# Patient Record
Sex: Female | Born: 2000 | Race: White | Hispanic: No | Marital: Single
Health system: Southern US, Community
[De-identification: ages and names within clinical notes are randomized; demographics above are authoritative.]

---
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
## 2019-03-07 IMAGING — CT CT ABD-PELV W/ CM
2 of 4 series · 16 of 46 positions shown, 18 images · IV contrast (iopamidol)
Comparison: None.

CLINICAL DATA: Acute lower abdominal pain.  Appendicitis suspected.

EXAM:
CT ABDOMEN AND PELVIS WITH CONTRAST
TECHNIQUE: Multidetector CT imaging of the abdomen and pelvis was performed
using the standard protocol following bolus administration of
intravenous contrast.
CONTRAST:  100mL M20DLF-CGG IOPAMIDOL (M20DLF-CGG) INJECTION 61%

[Series 2: axial st · axial · 0.73mm/px · z∈[+445,+850]mm · 13 of 89 slices shown, 15 images]
[im 4/89  soft-tissue]
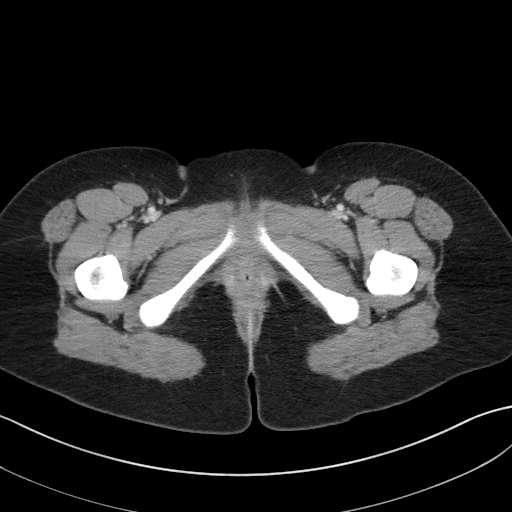
[im 4/89  bone]
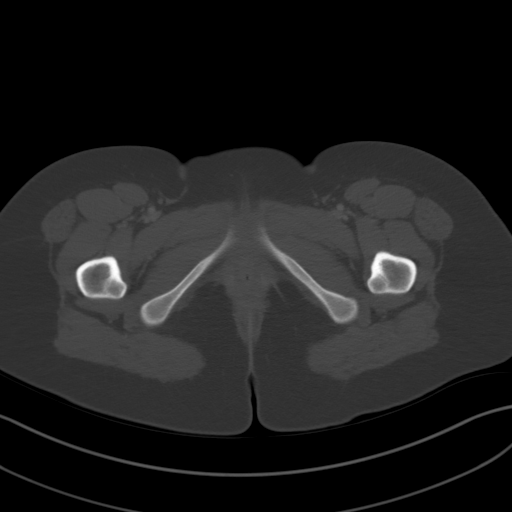
[im 12/89  soft-tissue]
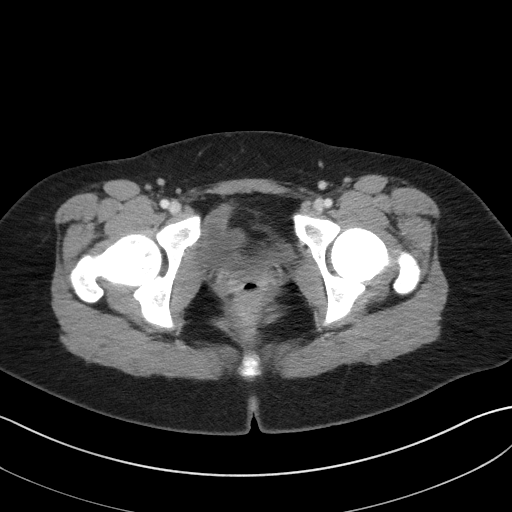
[im 19/89  soft-tissue]
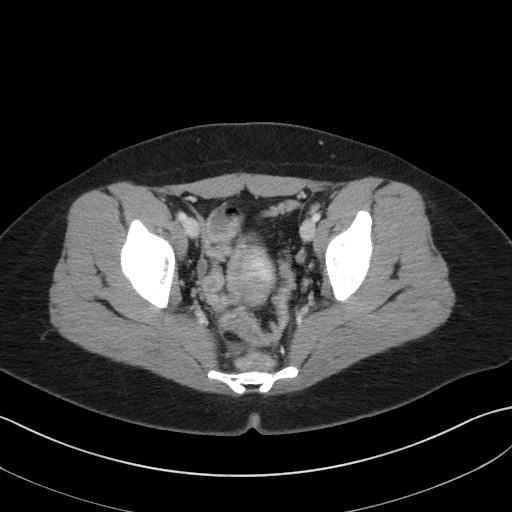
[im 26/89  soft-tissue]
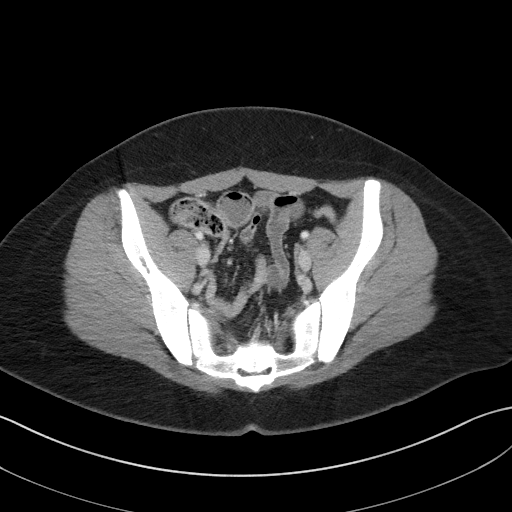
[im 30/89  soft-tissue]
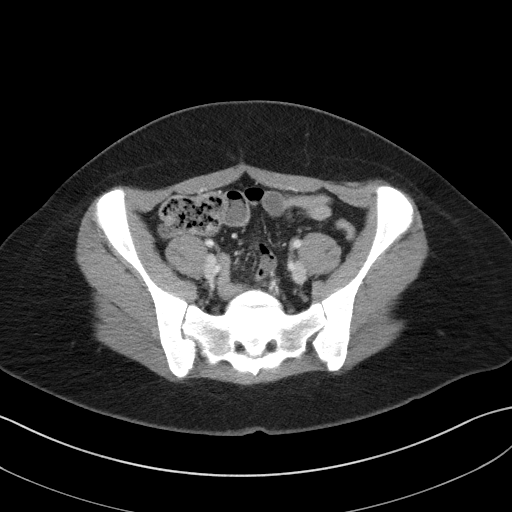
[im 37/89  soft-tissue]
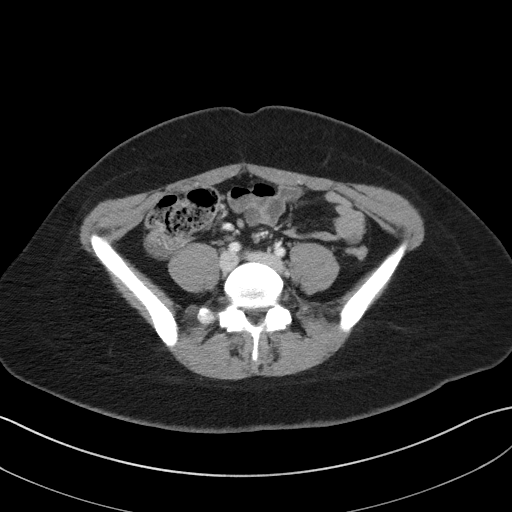
[im 45/89  soft-tissue]
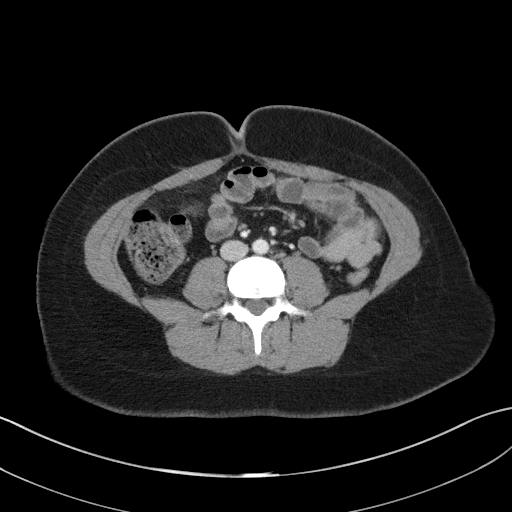
[im 52/89  soft-tissue]
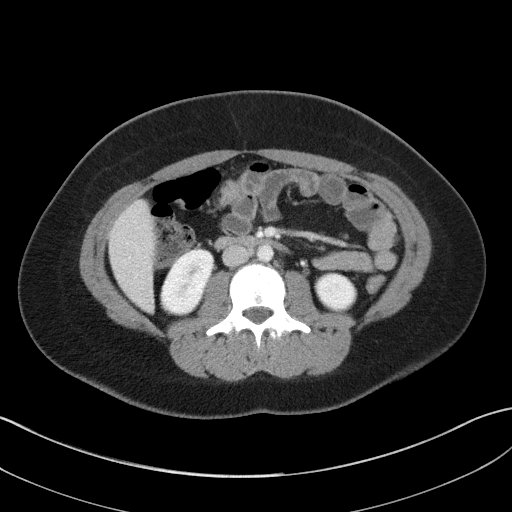
[im 59/89  soft-tissue]
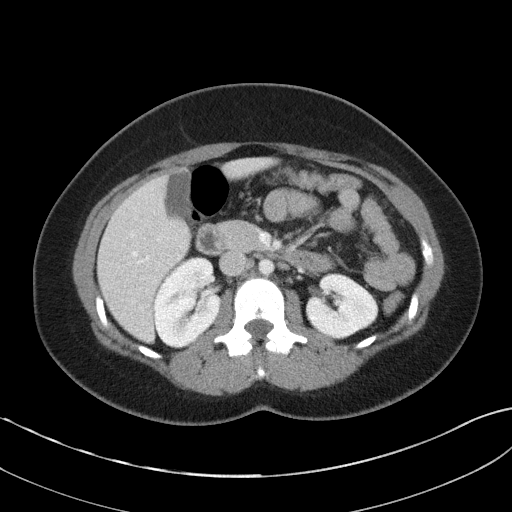
[im 59/89  bone]
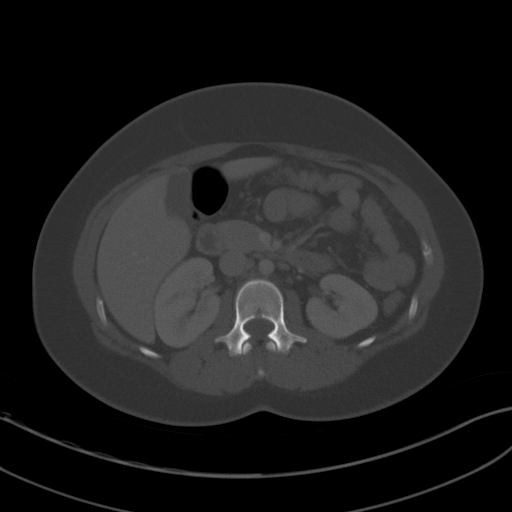
[im 63/89  soft-tissue]
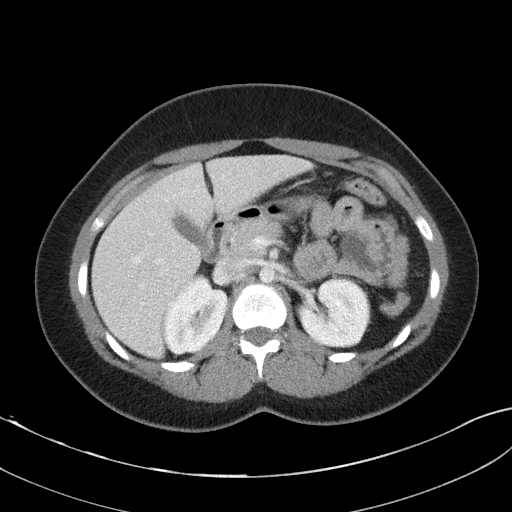
[im 70/89  soft-tissue]
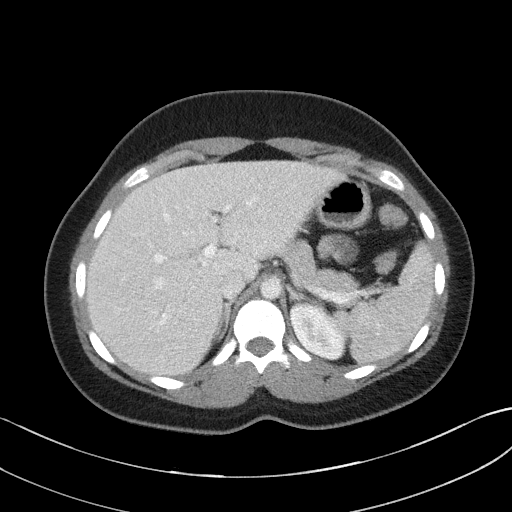
[im 78/89  soft-tissue]
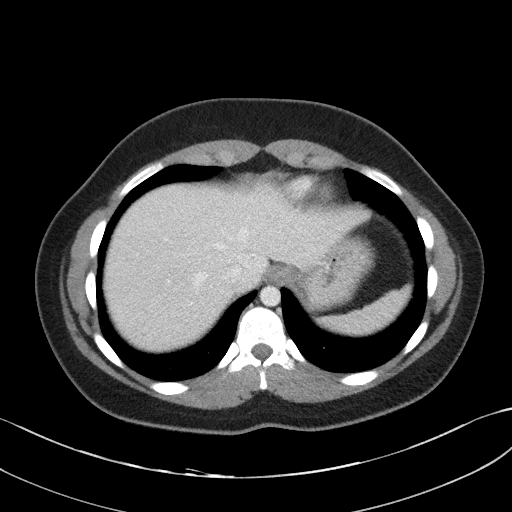
[im 85/89  soft-tissue]
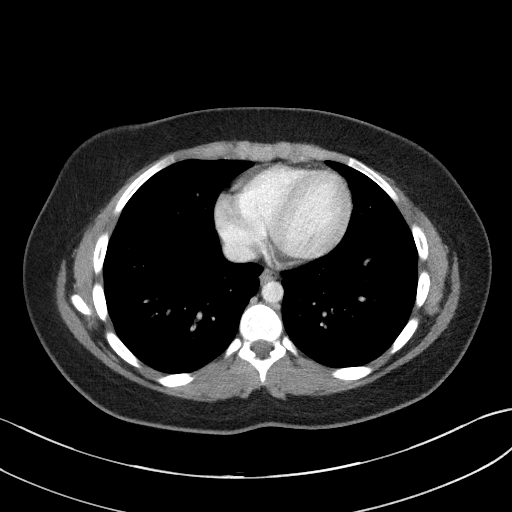

[Series 5: coronal st · coronal · 0.77mm/px · 3 of 86 slices shown]
[im 29/86  soft-tissue]
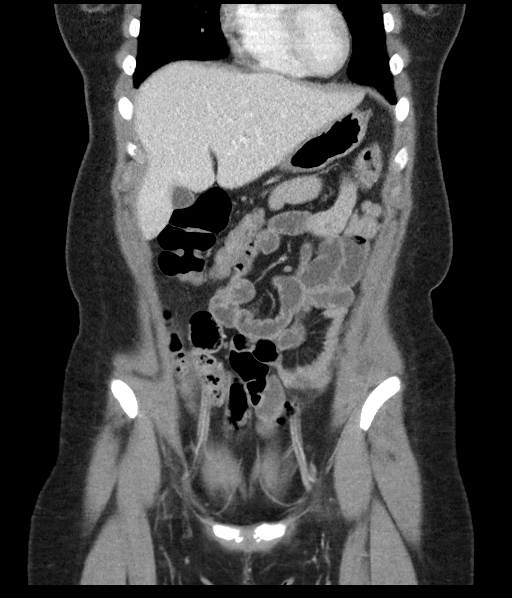
[im 38/86  soft-tissue]
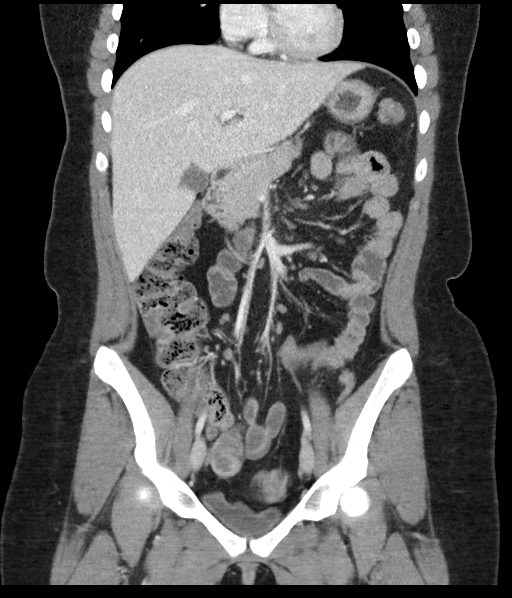
[im 48/86  soft-tissue]
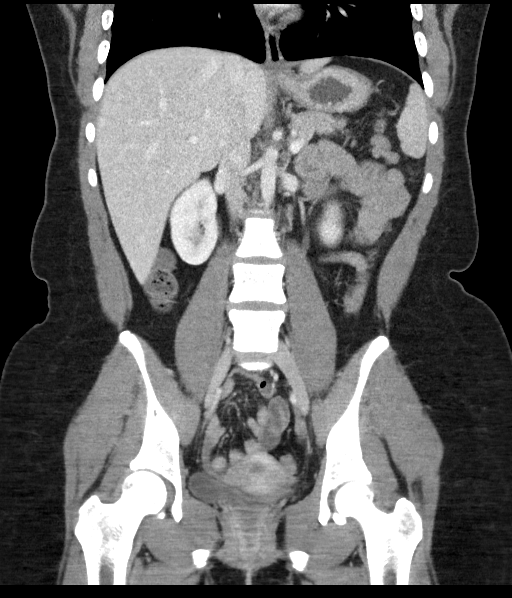

[16 of 46 positions shown; findings below may reference images not displayed]

FINDINGS: Lower chest: The lung bases are clear.

Hepatobiliary: No focal liver abnormality is seen. No gallstones,
gallbladder wall thickening, or biliary dilatation.

Pancreas: No ductal dilatation or inflammation.

Spleen: Normal in size without focal abnormality.

Adrenals/Urinary Tract: Adrenal glands are unremarkable. Kidneys are
normal, without renal calculi, focal lesion, or hydronephrosis.
Bladder is nondistended and not well evaluated.

Stomach/Bowel: Stomach is nondistended. No small bowel inflammation,
wall thickening or obstruction. Mild fecalization of distal ileal
contents without inflammatory change. Normal appendix for example
images 64-68 formed stool in the proximal colon, transverse through
the sigmoid colon are nondistended. There is mild colonic wall
enhancement of the sigmoid colon with liquid stool and pericolonic
edema, no definite colonic wall thickening.

Vascular/Lymphatic: Few prominent mesenteric lymph nodes measure up
to 8 mm short axis. No acute or suspicious vascular findings.

Reproductive: Unremarkable CT appearance of the uterus. Ovaries
symmetric in size and normal.

Other: Minimal free fluid in the pelvis. No free air or abscess. No
upper abdominal ascites.

Musculoskeletal: There are no acute or suspicious osseous
abnormalities.
IMPRESSION: Minimal liquid stool with wall enhancement and pericolonic edema
about the sigmoid colon, suggesting mild colitis. This may be
infectious or inflammatory. No other bowel inflammation.

## 2019-07-24 ENCOUNTER — Ambulatory Visit (INDEPENDENT_AMBULATORY_CARE_PROVIDER_SITE_OTHER): Payer: BC Managed Care – PPO | Admitting: Psychology

## 2019-07-24 DIAGNOSIS — F411 Generalized anxiety disorder: Secondary | ICD-10-CM | POA: Diagnosis not present

## 2019-08-08 ENCOUNTER — Ambulatory Visit (INDEPENDENT_AMBULATORY_CARE_PROVIDER_SITE_OTHER): Payer: BC Managed Care – PPO | Admitting: Psychology

## 2019-08-08 DIAGNOSIS — F411 Generalized anxiety disorder: Secondary | ICD-10-CM

## 2019-08-15 ENCOUNTER — Ambulatory Visit (INDEPENDENT_AMBULATORY_CARE_PROVIDER_SITE_OTHER): Payer: BC Managed Care – PPO | Admitting: Psychology

## 2019-08-15 DIAGNOSIS — F411 Generalized anxiety disorder: Secondary | ICD-10-CM | POA: Diagnosis not present

## 2019-08-29 ENCOUNTER — Ambulatory Visit: Payer: BC Managed Care – PPO | Admitting: Psychology

## 2019-09-05 ENCOUNTER — Ambulatory Visit (INDEPENDENT_AMBULATORY_CARE_PROVIDER_SITE_OTHER): Payer: BC Managed Care – PPO | Admitting: Psychology

## 2019-09-05 DIAGNOSIS — F411 Generalized anxiety disorder: Secondary | ICD-10-CM | POA: Diagnosis not present

## 2019-09-19 ENCOUNTER — Ambulatory Visit (INDEPENDENT_AMBULATORY_CARE_PROVIDER_SITE_OTHER): Payer: BC Managed Care – PPO | Admitting: Psychology

## 2019-09-19 DIAGNOSIS — F411 Generalized anxiety disorder: Secondary | ICD-10-CM | POA: Diagnosis not present

## 2019-09-28 ENCOUNTER — Ambulatory Visit (INDEPENDENT_AMBULATORY_CARE_PROVIDER_SITE_OTHER): Payer: BC Managed Care – PPO | Admitting: Psychology

## 2019-09-28 DIAGNOSIS — F411 Generalized anxiety disorder: Secondary | ICD-10-CM | POA: Diagnosis not present

## 2019-10-03 ENCOUNTER — Ambulatory Visit (INDEPENDENT_AMBULATORY_CARE_PROVIDER_SITE_OTHER): Payer: BC Managed Care – PPO | Admitting: Psychology

## 2019-10-03 DIAGNOSIS — F411 Generalized anxiety disorder: Secondary | ICD-10-CM | POA: Diagnosis not present

## 2019-10-10 ENCOUNTER — Ambulatory Visit (INDEPENDENT_AMBULATORY_CARE_PROVIDER_SITE_OTHER): Payer: BC Managed Care – PPO | Admitting: Psychology

## 2019-10-10 DIAGNOSIS — F411 Generalized anxiety disorder: Secondary | ICD-10-CM

## 2019-10-19 ENCOUNTER — Ambulatory Visit (INDEPENDENT_AMBULATORY_CARE_PROVIDER_SITE_OTHER): Payer: BC Managed Care – PPO | Admitting: Psychology

## 2019-10-19 DIAGNOSIS — F411 Generalized anxiety disorder: Secondary | ICD-10-CM

## 2019-10-24 ENCOUNTER — Ambulatory Visit (INDEPENDENT_AMBULATORY_CARE_PROVIDER_SITE_OTHER): Payer: BC Managed Care – PPO | Admitting: Psychology

## 2019-10-24 DIAGNOSIS — F411 Generalized anxiety disorder: Secondary | ICD-10-CM

## 2019-11-09 ENCOUNTER — Ambulatory Visit (INDEPENDENT_AMBULATORY_CARE_PROVIDER_SITE_OTHER): Payer: BC Managed Care – PPO | Admitting: Psychology

## 2019-11-09 DIAGNOSIS — F411 Generalized anxiety disorder: Secondary | ICD-10-CM | POA: Diagnosis not present

## 2019-11-21 ENCOUNTER — Ambulatory Visit (INDEPENDENT_AMBULATORY_CARE_PROVIDER_SITE_OTHER): Payer: BC Managed Care – PPO | Admitting: Psychology

## 2019-11-21 DIAGNOSIS — F411 Generalized anxiety disorder: Secondary | ICD-10-CM

## 2019-11-28 ENCOUNTER — Ambulatory Visit (INDEPENDENT_AMBULATORY_CARE_PROVIDER_SITE_OTHER): Payer: BC Managed Care – PPO | Admitting: Psychology

## 2019-11-28 DIAGNOSIS — F411 Generalized anxiety disorder: Secondary | ICD-10-CM | POA: Diagnosis not present

## 2019-12-05 ENCOUNTER — Ambulatory Visit (INDEPENDENT_AMBULATORY_CARE_PROVIDER_SITE_OTHER): Payer: BC Managed Care – PPO | Admitting: Psychology

## 2019-12-05 DIAGNOSIS — F411 Generalized anxiety disorder: Secondary | ICD-10-CM

## 2019-12-12 ENCOUNTER — Ambulatory Visit: Payer: BC Managed Care – PPO | Admitting: Psychology

## 2019-12-19 ENCOUNTER — Ambulatory Visit: Payer: BC Managed Care – PPO | Admitting: Psychology
